# Patient Record
Sex: Male | Born: 1965 | Race: White | Hispanic: No | Marital: Single
Health system: Southern US, Community
[De-identification: ages and names within clinical notes are randomized; demographics above are authoritative.]

## PROBLEM LIST (undated history)

## (undated) DIAGNOSIS — R7303 Prediabetes: Secondary | ICD-10-CM

## (undated) DIAGNOSIS — R0789 Other chest pain: Secondary | ICD-10-CM

## (undated) DIAGNOSIS — E785 Hyperlipidemia, unspecified: Secondary | ICD-10-CM

## (undated) DIAGNOSIS — N503 Cyst of epididymis: Secondary | ICD-10-CM

## (undated) DIAGNOSIS — H9190 Unspecified hearing loss, unspecified ear: Secondary | ICD-10-CM

## (undated) DIAGNOSIS — R42 Dizziness and giddiness: Secondary | ICD-10-CM

## (undated) DIAGNOSIS — M109 Gout, unspecified: Secondary | ICD-10-CM

## (undated) DIAGNOSIS — K921 Melena: Secondary | ICD-10-CM

## (undated) HISTORY — DX: Unspecified hearing loss, unspecified ear: H91.90

## (undated) HISTORY — PX: APPENDECTOMY: SHX54

## (undated) HISTORY — PX: HERNIA REPAIR: SHX51

## (undated) HISTORY — DX: Other chest pain: R07.89

## (undated) HISTORY — DX: Cyst of epididymis: N50.3

## (undated) HISTORY — DX: Melena: K92.1

## (undated) HISTORY — PX: OTHER SURGICAL HISTORY: SHX169

## (undated) HISTORY — PX: VASECTOMY: SHX75

## (undated) HISTORY — DX: Dizziness and giddiness: R42

## (undated) HISTORY — DX: Hyperlipidemia, unspecified: E78.5

## (undated) HISTORY — DX: Gout, unspecified: M10.9

## (undated) HISTORY — DX: Prediabetes: R73.03

---

## 2005-08-10 ENCOUNTER — Encounter (INDEPENDENT_AMBULATORY_CARE_PROVIDER_SITE_OTHER): Payer: Self-pay | Admitting: Specialist

## 2005-08-10 ENCOUNTER — Ambulatory Visit (HOSPITAL_COMMUNITY): Admission: EM | Admit: 2005-08-10 | Discharge: 2005-08-10 | Payer: Self-pay | Admitting: Emergency Medicine

## 2005-08-25 ENCOUNTER — Encounter: Admission: RE | Admit: 2005-08-25 | Discharge: 2005-08-25 | Payer: Self-pay | Admitting: General Surgery

## 2006-08-30 IMAGING — CT CT PELVIS W/ CM
2 of 5 series · 17 of 46 positions shown, 19 images · IV contrast (READICAT/WATER & [ID] OMNI 300)
Comparison: CT of the abdomen done at [facility] 08/10/05.

CLINICAL DATA: Postappendectomy 08/10/05.  Persistent pain and fever.  Question abscess. 
 ABDOMEN CT WITH CONTRAST:
TECHNIQUE: Multidetector CT imaging of the abdomen was performed following the standard protocol during bolus administration of intravenous contrast.
 Contrast:  100 cc Omnipaque 300.  Oral contrast was given.
TECHNIQUE: Multidetector CT imaging of the pelvis was performed following the standard protocol during bolus administration of intravenous contrast.

[Series 102: a&p w/ · axial · 0.74mm/px · z∈[-453,-58]mm · 14 of 431 slices shown, 16 images]
[im 18/431  soft-tissue]
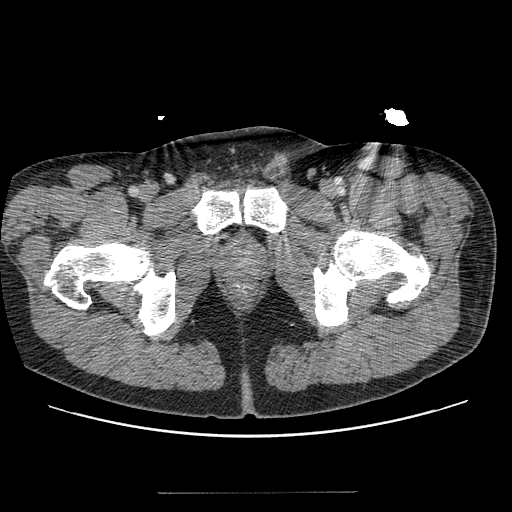
[im 18/431  bone]
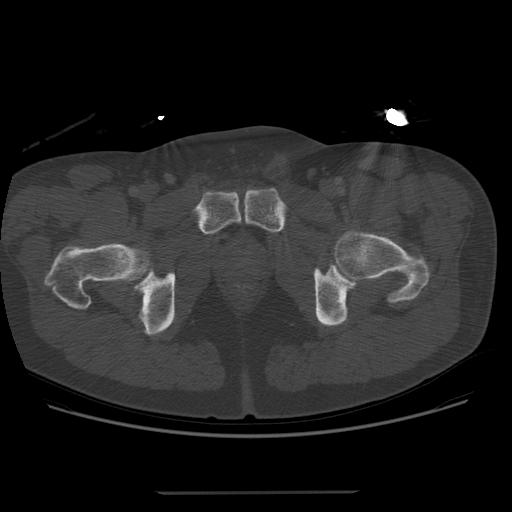
[im 54/431  soft-tissue]
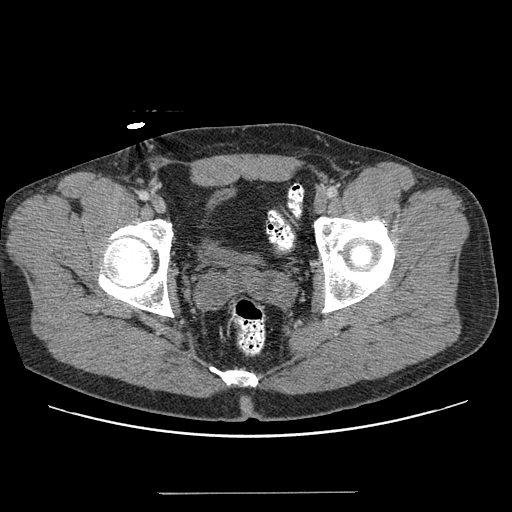
[im 90/431  soft-tissue]
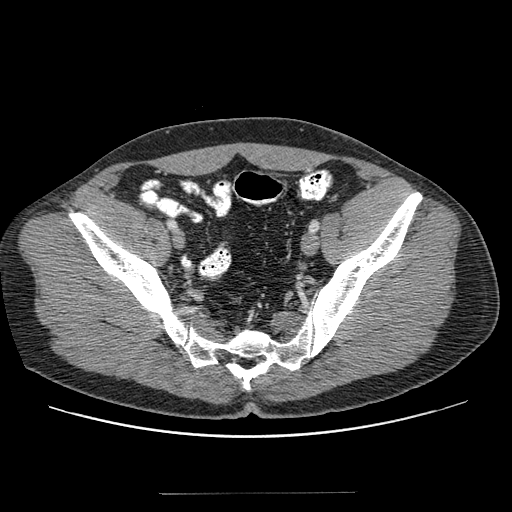
[im 108/431  soft-tissue]
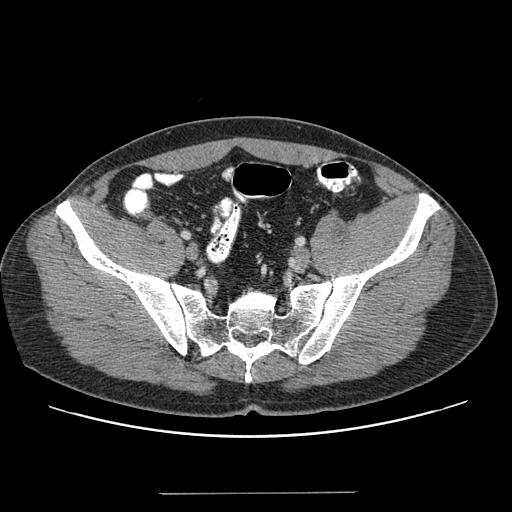
[im 144/431  soft-tissue]
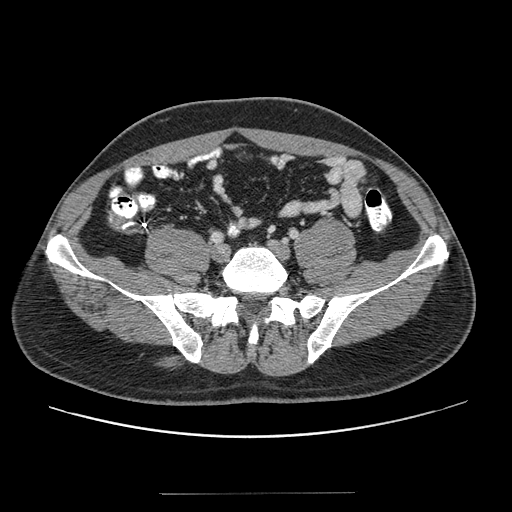
[im 180/431  soft-tissue]
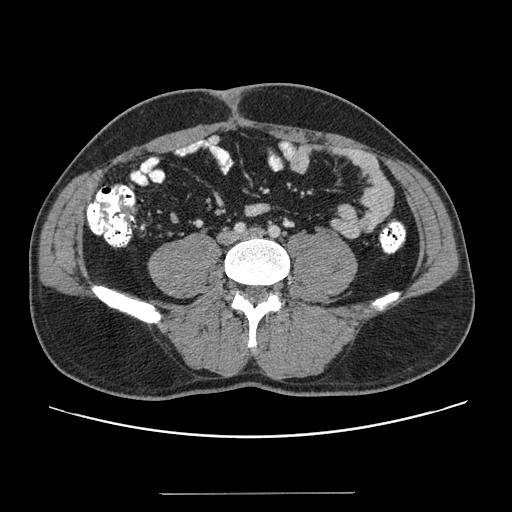
[im 198/431  soft-tissue]
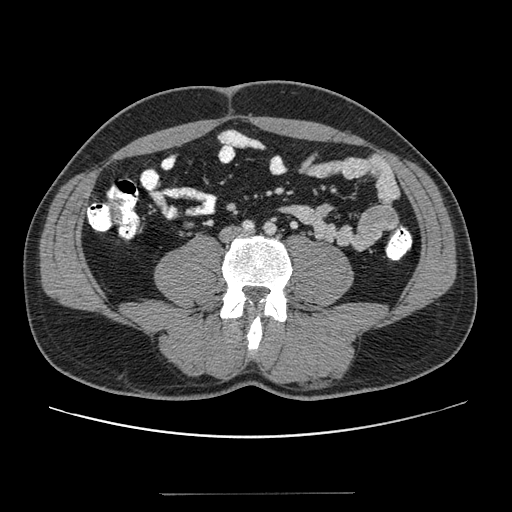
[im 233/431  soft-tissue]
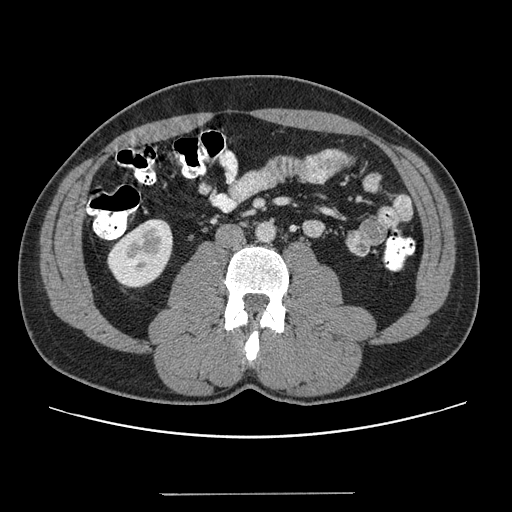
[im 251/431  soft-tissue]
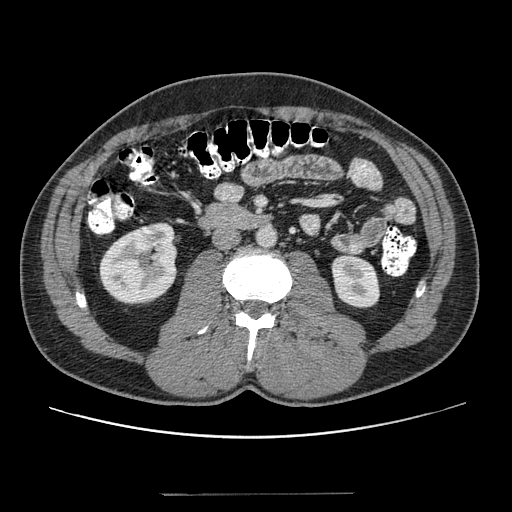
[im 251/431  bone]
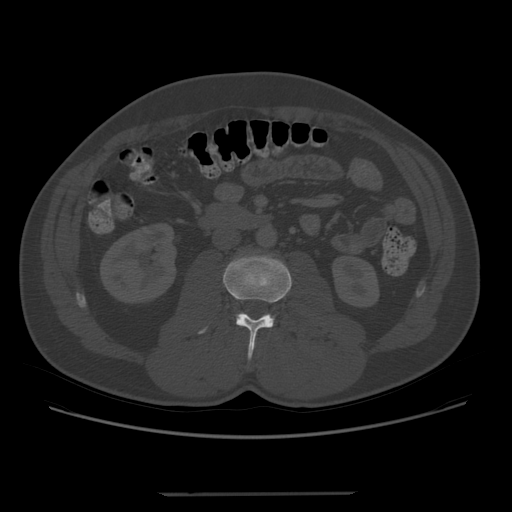
[im 287/431  soft-tissue]
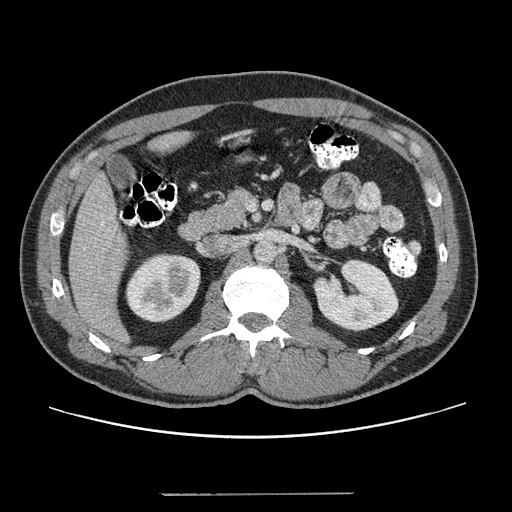
[im 323/431  soft-tissue]
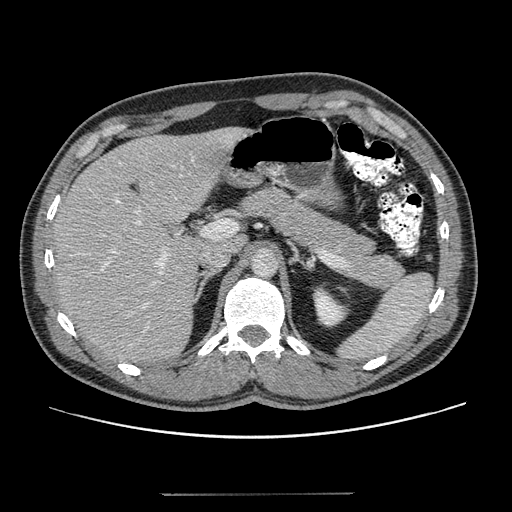
[im 341/431  soft-tissue]
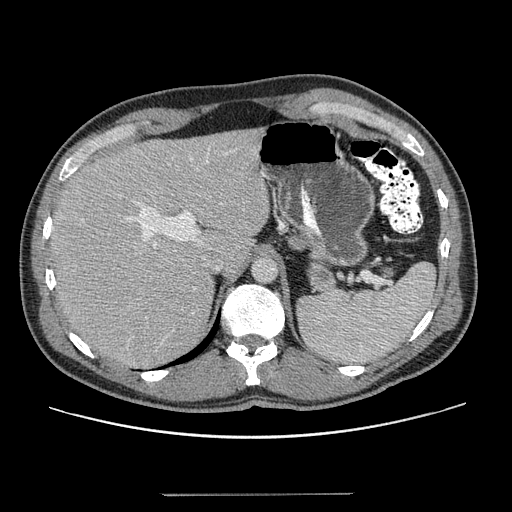
[im 377/431  soft-tissue]
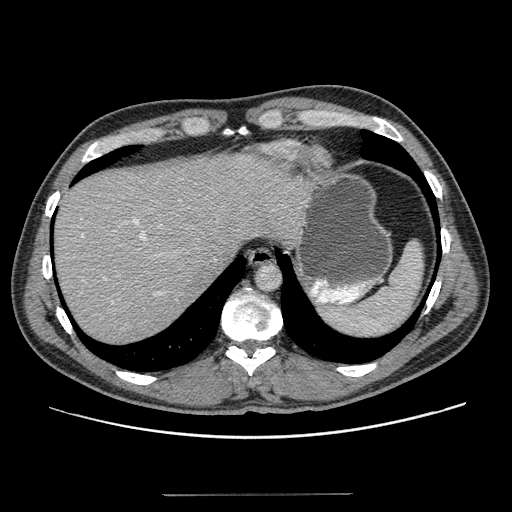
[im 413/431  soft-tissue]
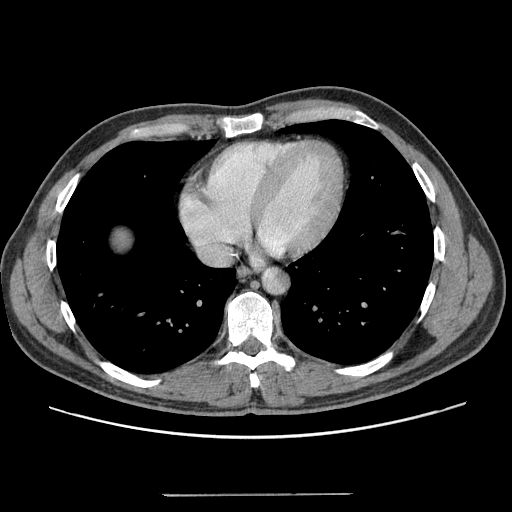

[Series 104: reformatted · coronal · 0.86mm/px · 3 of 82 slices shown]
[im 28/82  soft-tissue]
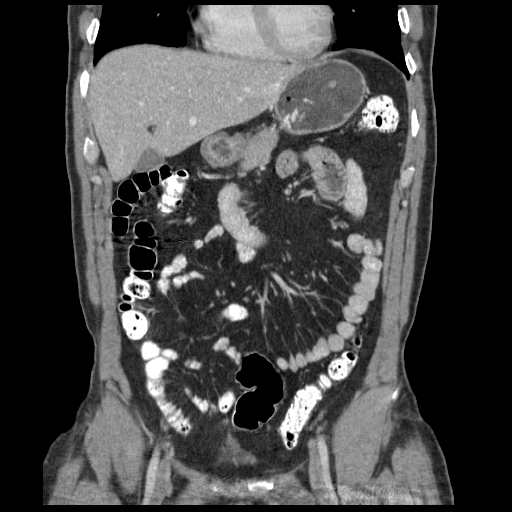
[im 37/82  soft-tissue]
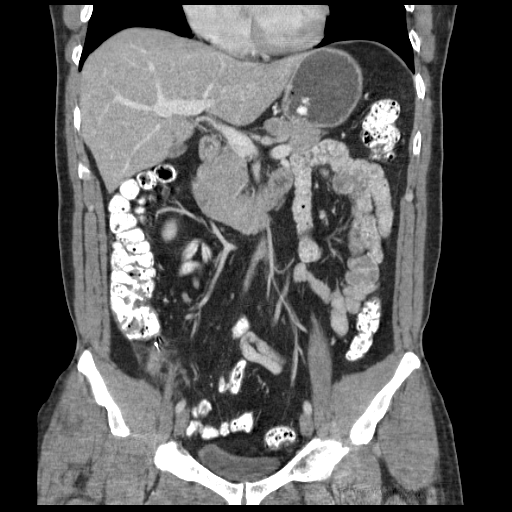
[im 46/82  soft-tissue]
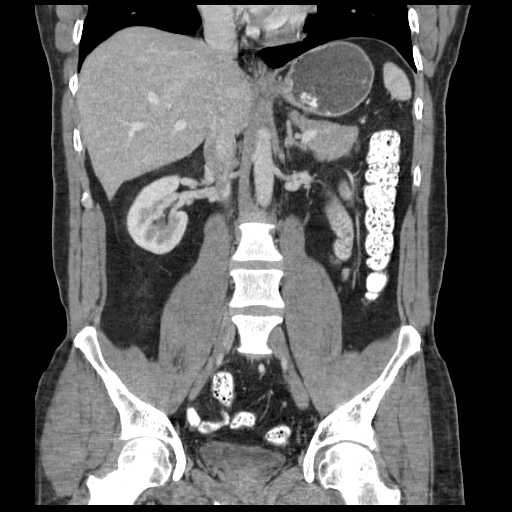

[17 of 46 positions shown; findings below may reference images not displayed]

FINDINGS: The lung bases are clear.  There is no pleural effusion.  The liver, spleen, gallbladder, pancreas, adrenal glands, and kidneys appear normal.  There is no evidence of ascites or intraabdominal abscess.  Small lymph nodes in the ileocolonic mesentery have decreased in size from the prior study.
IMPRESSION: No acute abdominal findings.  No evidence of abdominal abscess. 
 PELVIS CT WITH CONTRAST:
FINDINGS: There are postoperative changes in the right lower quadrant related to recent appendectomy.  Minimal stranding is seen adjacent to the cecal tip.  There is no pelvic fluid collection or adenopathy.  There is no evidence of hernia.  Prostate gland, seminal vesicles, and bladder appear stable.
IMPRESSION: Postoperative changes related to recent appendectomy.  No evidence of pelvic abscess or acute inflammatory process.

## 2008-08-05 ENCOUNTER — Ambulatory Visit (HOSPITAL_COMMUNITY): Admission: RE | Admit: 2008-08-05 | Discharge: 2008-08-05 | Payer: Self-pay | Admitting: General Surgery

## 2010-11-22 NOTE — Op Note (Signed)
NAMECantrell, [name]                ACCOUNT NO.:  [id number]   MEDICAL RECORD NO.:  [id number]          PATIENT TYPE:  AMB   LOCATION:  DAY                          FACILITY:  [facility]   PHYSICIAN:  Sharlet Salina T. Hoxworth, M.D.DATE OF BIRTH:  05-Nov-1965   DATE OF PROCEDURE:  08/05/2008  DATE OF DISCHARGE:                               OPERATIVE REPORT   PREOPERATIVE DIAGNOSIS:  Bilateral inguinal hernia.   POSTOPERATIVE DIAGNOSIS:  Bilateral inguinal hernia.   SURGICAL PROCEDURES:  Laparoscopic repair of bilateral inguinal hernia.   SURGEON:  Lorne Skeens. Hoxworth, M.D.   ANESTHESIA:  General.   BRIEF HISTORY:  [name] is a 45 year old male who on a recent exam by  his primary physician was found to have a definite bulge in his right  groin.  This has been somewhat uncomfortable as well.  Examination has  revealed a moderate-sized, reducible, left inguinal hernia and also a  definite small to moderate-sized right inguinal hernia as well.  After  discussion of options, we have elected proceed with laparoscopic  bilateral repair.  The nature of the procedure, its indications and  risks of bleeding infection, recurrence, anesthetic complications,  possible open surgery and slight risk of bowel or bladder injury have  been discussed understood.  He is now brought to operating room for this  procedure.   DESCRIPTION OF PROCEDURE:  The patient brought to the operating room and  placed in supine position on the operating table and general  endotracheal anesthesia was induced.  The abdomen and groins were widely  sterilely prepped and draped.  PAS were placed.  He received  preoperative IV antibiotics.  Correct patient and procedure were  verified.  A 1 cm incision was made beneath the umbilicus at the site of  his previous laparoscopic appendectomy scar.  Dissection carried down to  the anterior sheath which was opened just to the left of midline  transversely for 1 cm and the medial edge  of the left rectus muscle  retracted medially and the preperitoneal space entered under direct  vision.  The balloon dissector was passed into this space and passed  down the midline until its tip was at the pubic symphysis.  Then under  direct laparoscopic vision, the balloon was inflated with good bilateral  deployment.  It was left in place for a couple of minutes for  hemostasis, then removed and the structural balloon trocar inflated and  CO2 pressure applied.  There had been reasonably good bilateral  dissection but there was an opening in the peritoneum on the left 2 or 3  cm in length where there had been adhesions up around the internal ring  from probably his previous varicocele repair.  This resulted in  significant pneumoperitoneum and I then evacuated this without  difficulty in the right upper quadrant with a variation needle.  The  peritoneum was mobilized off its adhesions around the internal ring and  dissected posteriorly on the left and then the peritoneal defect was  closed securely with 5 mm clips.  This resulted in a pretty good  decompression of  the pneumoperitoneum and allowed plenty of working  room.  The peritoneum was then dissected further back posteriorly off of  the cord and there was no indirect defect.  The pubic symphysis,  Cooper's ligament were cleared out of the iliac vessels which were  identified and protected.  Just lateral to this, there was a good sized  direct defect, the pseudo sac and preperitoneal fat was completely  dissected out of the defect.  The pseudo sac completely mobilized and  Cooper's ligament cleared just beneath this and borders well-defined and  dissected.  Following this the peritoneum was dissected out laterally  dissecting it posterior away from the anterior abdominal wall out to  level of the anterior-superior iliac spine at the umbilicus.  The  peritoneum was then followed back again medially and again was well off  the cord  structures and there was no indirect hernia.  Following this  complete dissection of the left side, attention was turned to the right.  An identical dissection was performed.  There was actually a very small  opening in the peritoneum noted after dissecting the peritoneum off of  the cord structures which was closed with a couple of clips.  There did  appear to be a little scarring up around the internal ring of the  peritoneum but there was no indirect hernia.  Also noted on the right  was a moderate-sized direct hernia which was dissected in identical  fashion.  Following dissection of the right side, an extra large Bard 3-  D Max left-sided piece of mesh was introduced into the left  preperitoneal space and oriented and then tacked initially to the pubic  symphysis and down to Cooper's ligament with 2 or 3 tacks avoiding  carefully the iliac vessels.  The lateral edge of the mesh was then  tacked well out on the anterior abdominal wall being able to feel the  tacker through the abdominal wall and came back along the superior edge  and medial edge with tacks and a few additional tacks were placed along  the inguinal ligament again being able to feel the tacker through the  abdominal wall.  This provided nice very broad coverage of the direct  defect and extended well out laterally as well.  Following this, a large  right-sided piece of mesh was introduced in the right preperitoneal  space, oriented and fixed in an identical fashion.  Operative site was  inspect for hemostasis which appeared complete and then all CO2 was  evacuated and trocars removed.  The fascial defect at the umbilicus was  closed with a figure-of-eight 0 Vicryl.  The 5 mm trocar sites were  closed with Dermabond and the umbilical site due to a little oozing from  the scarring from its previous surgery was closed with a running 4-0  nylon with good hemostasis.  Sponge and needle counts were correct.  Dry  sterile  dressings were applied.  The patient taken to recovery in good  condition.      Lorne Skeens. Hoxworth, M.D.  Electronically Signed     BTH/MEDQ  D:  08/05/2008  T:  08/05/2008  Job:  [id number]

## 2010-11-25 NOTE — H&P (Signed)
NAMEWALDRON, GERRY                ACCOUNT NO.:  [id number]   MEDICAL RECORD NO.:  [id number]           PATIENT TYPE:   LOCATION:                                 FACILITY:   PHYSICIAN:  Sharlet Salina T. Hoxworth, M.D.  DATE OF BIRTH:   DATE OF ADMISSION:  08/10/2005  DATE OF DISCHARGE:                                HISTORY & PHYSICAL   CHIEF COMPLAINT:  Right lower quadrant abdominal pain.   HISTORY OF PRESENT ILLNESS:  [name] is a generally healthy 45 year old  white male who about 36 hours prior to admission developed poorly localized  abdominal pain across his lower abdomen.  Over the next few hours it  gradually localized to his right lower quadrant and became more severe and  constant.  He presented to his primary care office, was found to have  tenderness in the right lower quadrant and a white count of 11.9 and was  referred to [facility] Emergency Room for further evaluation.  He states  his pain is constant and worse with movement.  He has no history of any  previous similar or chronic GI or abdominal complaints.  He has not had a  bowel movement in about two days and feels a little constipated.  He has not  had any nausea or vomiting.  No fever or chills.   PAST MEDICAL HISTORY:  He has had a varicocele repair.  No significant  medical or surgical problems.   MEDICATIONS:  None.   ALLERGIES:  None.   SOCIAL HISTORY:  He is an Acupuncturist.  He is single.  Does not  smoke cigarettes.  Drinks occasional alcohol.   FAMILY HISTORY:  Noncontributory.   REVIEW OF SYSTEMS:  GENERAL:  No fever, chills, malaise.  HEENT:  No vision,  hearing, swallowing problems.  RESPIRATORY:  No shortness of breath, cough,  history of asthma.  CARDIAC:  No chest pain, palpitations, history of heart  disease.  ABDOMEN/GI:  As above.  GU: No urinary burning, frequency.  NEUROLOGIC:  No history of blood clots or abnormal bleeding.   PHYSICAL EXAMINATION:  VITAL SIGNS:  Temperature  is 97.8, pulse 77,  respirations 20, blood pressure 136/83.  GENERAL:  Well-developed male in no acute distress.  SKIN:  Warm and dry.  No rash or infection.  HEENT:  No palpable mass or thyromegaly.  Sclerae nonicteric.  LYMPH NODE:  No cervical, subclavicular, or inguinal nodes palpable.  LUNGS:  Clear without wheezing or increased work of breathing.  CARDIAC:  Regular rate and rhythm.  There is no murmurs.  No edema.  ABDOMEN:  Nondistended.  Hypoactive bowel sounds.  There is localized  tenderness in the right lower quadrant with guarding and evidence of local  peritonitis.  No palpable masses or splenomegaly.  GU:  Normal male.  EXTREMITIES:  No joint swelling/deformity.  NEUROLOGIC:  Alert, oriented.  Motor and sensory examination grossly normal.   LABORATORY AND X-RAY DATA:  BMET and CBC obtained in the emergency room are  normal.  Previous white count in primary care physician's office today  earlier  11.8.   CT scan of the abdomen and pelvis personally reviewed.  This shows fluid-  filled distended appendix with periappendiceal inflammatory changes  consistent with acute appendicitis.   ASSESSMENT/PLAN:  Acute appendicitis.  The patient is being given broad-  spectrum intravenous antibiotics.  He will be taken to the operating room  for laparoscopic appendectomy.      Lorne Skeens. Hoxworth, M.D.  Electronically Signed     BTH/MEDQ  D:  08/10/2005  T:  08/10/2005  Job:  [id number]

## 2010-11-25 NOTE — Op Note (Signed)
NAMEQUNICY, HIGINBOTHAM                ACCOUNT NO.:  [id number]   MEDICAL RECORD NO.:  [id number]          PATIENT TYPE:  INP   LOCATION:  0098                         [facility]AC[facility]   PHYSICIAN:  Sharlet Salina T. Hoxworth, M.D.DATE OF BIRTH:  1966-01-06   DATE OF PROCEDURE:  08/10/2005  DATE OF DISCHARGE:                                 OPERATIVE REPORT   PREOPERATIVE DIAGNOSIS:  Acute appendicitis.   POSTOPERATIVE DIAGNOSIS:  Acute appendicitis.   SURGICAL PROCEDURES:  Laparoscopic appendectomy.   SURGEON:  Dr. Johna Sheriff   ANESTHESIA:  General.   BRIEF HISTORY:  Marck Mcclenny is a 45 year old male, who presents with 64-  hour history of diffuse lower and then right lower quadrant abdominal pain.  A CT scan was obtained in the emergency room showing evidence of acute  appendicitis.  Laparoscopic and possible open appendectomy have been  recommend and accepted. This procedure, indications, risks of bleeding,  infection were discussed and understood.  He is now brought to operating  room for this procedure.   DESCRIPTION OF OPERATION:  The patient brought to the operating room and  placed in supine position on the operating table, and general endotracheal  anesthesia was induced.  He had received preoperative IV antibiotics.  Foley  catheter was placed.  The abdomen was widely sterilely prepped and draped.  Correct patient and procedure were verified.  Local anesthesia was used to  infiltrate the trocar sites prior to the incisions.  A 1 cm incision was  made at the umbilicus and dissection carried down to the midline fascia  which was sharply incised for 1 cm and the peritoneum entered under direct  vision.  Through a mattress sutures of 0 Vicryl, the Hasson trocar was  placed and pneumoperitoneum established.  Under direct vision, a 5 mm trocar  was placed in the right upper quadrant and a 12 mm trocar in the left lower  quadrant.  The terminal ileum was densely adherent to the  lateral pelvic  sidewall with inflammatory adhesions. These were carefully taken down with  blunt dissection and the terminal ileum further mobilized more distally,  dividing peritoneal attachments.  The appendix was lying on the terminal  ileum, was severely acutely inflamed with exudate but no gangrene or  perforation.  The appendix was fairly densely adherent to the terminal ileum  but was able to be teased away with careful blunt dissection and some use of  the harmonic scalpel, avoiding the bowel.  The terminal ileum, cecum could  then be rotated medially, and the base the appendix was seen more laterally  which was noninflamed.  The mesoappendix was then sequentially divided with  the harmonic scalpel, further mobilizing the appendix until I was able to  get it completely freed down to its base which was noninflamed.  There was  some bleeding at this point from the mesoappendix, but this was controlled  with hemoclips.  The appendix was then divided at or just below its base  with the single firing of the GIA 45 mm blue load stapler.  The appendix was  placed in EndoCatch  bag and brought out through the umbilicus.  The right  lower quadrant was then thoroughly irrigated.  A few smaller bleeding points  along the mesoappendix were controlled with harmonic scalpel, and the  abdomen was thoroughly irrigated and complete hemostasis assured.  Following  this,  all CO2 was evacuated, trocars removed, and the mattress suture secured at  the umbilicus.  Skin incisions were closed with interrupted subcuticular 4-0  Monocryl and Steri-Strips.  Sponge, needle, and instrument counts correct.  Dry sterile dressings were applied and the patient taken to recovery in good  condition.      Lorne Skeens. Hoxworth, M.D.  Electronically Signed     BTH/MEDQ  D:  08/10/2005  T:  08/10/2005  Job:  [id number]

## 2016-07-13 DIAGNOSIS — M109 Gout, unspecified: Secondary | ICD-10-CM | POA: Diagnosis not present

## 2016-07-13 DIAGNOSIS — Z0000 Encounter for general adult medical examination without abnormal findings: Secondary | ICD-10-CM | POA: Diagnosis not present

## 2016-07-13 DIAGNOSIS — E782 Mixed hyperlipidemia: Secondary | ICD-10-CM | POA: Diagnosis not present

## 2017-07-19 DIAGNOSIS — J322 Chronic ethmoidal sinusitis: Secondary | ICD-10-CM | POA: Diagnosis not present

## 2017-07-19 DIAGNOSIS — H8103 Meniere's disease, bilateral: Secondary | ICD-10-CM | POA: Diagnosis not present

## 2017-07-19 DIAGNOSIS — J320 Chronic maxillary sinusitis: Secondary | ICD-10-CM | POA: Diagnosis not present

## 2017-08-02 DIAGNOSIS — L821 Other seborrheic keratosis: Secondary | ICD-10-CM | POA: Diagnosis not present

## 2017-08-02 DIAGNOSIS — L738 Other specified follicular disorders: Secondary | ICD-10-CM | POA: Diagnosis not present

## 2017-08-02 DIAGNOSIS — L218 Other seborrheic dermatitis: Secondary | ICD-10-CM | POA: Diagnosis not present

## 2017-09-19 DIAGNOSIS — M109 Gout, unspecified: Secondary | ICD-10-CM | POA: Diagnosis not present

## 2017-10-24 DIAGNOSIS — Z1159 Encounter for screening for other viral diseases: Secondary | ICD-10-CM | POA: Diagnosis not present

## 2017-10-24 DIAGNOSIS — Z13220 Encounter for screening for lipoid disorders: Secondary | ICD-10-CM | POA: Diagnosis not present

## 2017-10-24 DIAGNOSIS — Z0000 Encounter for general adult medical examination without abnormal findings: Secondary | ICD-10-CM | POA: Diagnosis not present

## 2018-01-24 DIAGNOSIS — R0789 Other chest pain: Secondary | ICD-10-CM | POA: Diagnosis not present

## 2018-01-24 DIAGNOSIS — M109 Gout, unspecified: Secondary | ICD-10-CM | POA: Diagnosis not present

## 2018-01-24 DIAGNOSIS — E782 Mixed hyperlipidemia: Secondary | ICD-10-CM | POA: Diagnosis not present

## 2018-02-04 DIAGNOSIS — R9389 Abnormal findings on diagnostic imaging of other specified body structures: Secondary | ICD-10-CM | POA: Diagnosis not present

## 2018-03-27 DIAGNOSIS — M109 Gout, unspecified: Secondary | ICD-10-CM | POA: Diagnosis not present

## 2018-03-27 DIAGNOSIS — E782 Mixed hyperlipidemia: Secondary | ICD-10-CM | POA: Diagnosis not present

## 2018-03-29 DIAGNOSIS — E782 Mixed hyperlipidemia: Secondary | ICD-10-CM | POA: Diagnosis not present

## 2018-04-30 DIAGNOSIS — H5213 Myopia, bilateral: Secondary | ICD-10-CM | POA: Diagnosis not present

## 2018-05-03 DIAGNOSIS — Z23 Encounter for immunization: Secondary | ICD-10-CM | POA: Diagnosis not present

## 2018-06-10 DIAGNOSIS — Z1211 Encounter for screening for malignant neoplasm of colon: Secondary | ICD-10-CM | POA: Diagnosis not present

## 2019-10-11 ENCOUNTER — Ambulatory Visit: Payer: 59

## 2019-10-11 ENCOUNTER — Ambulatory Visit: Payer: 59 | Attending: Internal Medicine

## 2019-10-11 DIAGNOSIS — Z23 Encounter for immunization: Secondary | ICD-10-CM

## 2019-10-11 NOTE — Progress Notes (Signed)
   Covid-19 Vaccination Clinic  Name:  [name]    MRN: [id number] DOB: 01-09-1966  10/11/2019  [name] was observed post Covid-19 immunization for 15 minutes without incident. He was provided with Vaccine Information Sheet and instruction to access the V-Safe system.   [name] was instructed to call 911 with any severe reactions post vaccine: Marland Kitchen Difficulty breathing  . Swelling of face and throat  . A fast heartbeat  . A bad rash all over body  . Dizziness and weakness   Immunizations Administered    Name Date Dose VIS Date Route   Pfizer COVID-19 Vaccine 10/11/2019  4:19 PM 0.3 mL 06/20/2019 Intramuscular   Manufacturer: [employer], [employer]   Lot: [id number]   NDC: [address]-9

## 2019-11-04 ENCOUNTER — Ambulatory Visit: Payer: 59 | Attending: Internal Medicine

## 2019-11-04 DIAGNOSIS — Z23 Encounter for immunization: Secondary | ICD-10-CM

## 2019-11-04 NOTE — Progress Notes (Signed)
   Covid-19 Vaccination Clinic  Name:  [name]    MRN: [id number] DOB: [birth year]  11/04/2019  [name] was observed post Covid-19 immunization for 15 minutes without incident. He was provided with Vaccine Information Sheet and instruction to access the V-Safe system.   [name] was instructed to call 911 with any severe reactions post vaccine: Marland Kitchen Difficulty breathing  . Swelling of face and throat  . A fast heartbeat  . A bad rash all over body  . Dizziness and weakness   Immunizations Administered    Name Date Dose VIS Date Route   Pfizer COVID-19 Vaccine 11/04/2019  3:22 PM 0.3 mL 09/03/2018 Intramuscular   Manufacturer: [employer], [employer]   Lot: [id number]   NDC: [address]-6

## 2020-04-11 ENCOUNTER — Other Ambulatory Visit (INDEPENDENT_AMBULATORY_CARE_PROVIDER_SITE_OTHER): Payer: Self-pay | Admitting: Otolaryngology

## 2020-04-12 ENCOUNTER — Other Ambulatory Visit (INDEPENDENT_AMBULATORY_CARE_PROVIDER_SITE_OTHER): Payer: Self-pay | Admitting: Otolaryngology

## 2020-04-20 ENCOUNTER — Telehealth (INDEPENDENT_AMBULATORY_CARE_PROVIDER_SITE_OTHER): Payer: Self-pay

## 2020-06-11 ENCOUNTER — Ambulatory Visit: Payer: 59 | Attending: Internal Medicine

## 2020-06-11 DIAGNOSIS — Z23 Encounter for immunization: Secondary | ICD-10-CM

## 2020-06-11 NOTE — Progress Notes (Signed)
   Covid-19 Vaccination Clinic  Name:  [name]    MRN: [id number] DOB: [birth year]  06/11/2020  [name] was observed post Covid-19 immunization for 15 minutes without incident. He was provided with Vaccine Information Sheet and instruction to access the V-Safe system.   [name] was instructed to call 911 with any severe reactions post vaccine: Marland Kitchen Difficulty breathing  . Swelling of face and throat  . A fast heartbeat  . A bad rash all over body  . Dizziness and weakness   Immunizations Administered    Name Date Dose VIS Date Route   Pfizer COVID-19 Vaccine 06/11/2020  3:26 PM 0.3 mL 04/28/2020 Intramuscular   Manufacturer: [employer], [employer]   Lot: [id number]   NDC: [address]-5

## 2023-10-31 ENCOUNTER — Encounter: Payer: Self-pay | Admitting: Cardiovascular Disease

## 2023-10-31 ENCOUNTER — Ambulatory Visit: Payer: 59 | Attending: Cardiovascular Disease | Admitting: Cardiovascular Disease

## 2023-10-31 VITALS — BP 116/68 | HR 61 | Ht 67.0 in | Wt 187.2 lb

## 2023-10-31 DIAGNOSIS — Z7689 Persons encountering health services in other specified circumstances: Secondary | ICD-10-CM

## 2023-10-31 DIAGNOSIS — Z01810 Encounter for preprocedural cardiovascular examination: Secondary | ICD-10-CM | POA: Diagnosis not present

## 2023-10-31 DIAGNOSIS — Z8249 Family history of ischemic heart disease and other diseases of the circulatory system: Secondary | ICD-10-CM

## 2023-10-31 DIAGNOSIS — R079 Chest pain, unspecified: Secondary | ICD-10-CM | POA: Diagnosis not present

## 2023-10-31 NOTE — Patient Instructions (Addendum)
 Lab Work: BMET today If you have labs (blood work) drawn today and your tests are completely normal, you will receive your results only by: [employer] (if you have MyChart) OR A paper copy in the mail If you have any lab test that is abnormal or we need to change your treatment, we will call you to review the results.  Testing/Procedures: Coronary CT Angiogram Cardiac CT Angiography (CTA), is a special type of CT scan that uses a computer to produce multi-dimensional views of major blood vessels throughout the body. In CT angiography, a contrast material is injected through an IV to help visualize the blood vessels  Follow-Up: At [facility], you and your health needs are our priority.  As part of our continuing mission to provide you with exceptional heart care, our providers are all part of one team.  This team includes your primary Cardiologist (physician) and Advanced Practice Providers or APPs (Physician Assistants and Nurse Practitioners) who all work together to provide you with the care you need, when you need it.  Your next appointment:   As Needed  Provider:   Ahmad Alert, MD  Other Instructions   Your cardiac CT will be scheduled at one of the below locations:   [facility] [address] [place], [place] [address] [phone]  Jeralene Mom. [facility] and Vascular Tower [address]  [place], [place] [address] Opening November 05, 2023  If scheduled at [facility], please arrive at the [facility] and Children's Entrance (Entrance C2) of [facility] 30 minutes prior to test start time. You can use the FREE valet parking offered at entrance C (encouraged to control the heart rate for the test)  Proceed to the [facility] Radiology Department (first floor) to check-in and test prep.   All radiology patients and guests should use entrance C2 at [facility], accessed from [facility], even though the hospital's  physical address listed is [address].    If scheduled at the Heart and Vascular Tower at [address], please enter the parking lot using the [address] entrance and use the FREE valet service at the patient drop-off area. Enter the buidling and check-in with registration on the main floor.  Please follow these instructions carefully (unless otherwise directed):  An IV will be required for this test and Nitroglycerin will be given.  Hold all erectile dysfunction medications at least 3 days (72 hrs) prior to test. (Ie viagra, cialis, sildenafil, tadalafil, etc)   On the Night Before the Test: Be sure to Drink plenty of water. Do not consume any caffeinated/decaffeinated beverages or chocolate 12 hours prior to your test. Do not take any antihistamines 12 hours prior to your test.  On the Day of the Test: Drink plenty of water until 1 hour prior to the test. Do not eat any food 1 hour prior to test. You may take your regular medications prior to the test.  If you take Chlorthalidone, please HOLD on the morning of the test. Patients who wear a continuous glucose monitor MUST remove the device prior to scanning.      After the Test: Drink plenty of water. After receiving IV contrast, you may experience a mild flushed feeling. This is normal. On occasion, you may experience a mild rash up to 24 hours after the test. This is not dangerous. If this occurs, you can take Benadryl 25 mg, Zyrtec, Claritin, or Allegra and increase your fluid intake. (  Patients taking Tikosyn should avoid Benadryl, and may take Zyrtec, Claritin, or Allegra) If you experience trouble breathing, this can be serious. If it is severe call 911 IMMEDIATELY. If it is mild, please call our office.  We will call to schedule your test 2-4 weeks out understanding that some insurance companies will need an authorization prior to the service being performed.   For more information and frequently asked  questions, please visit our website : [email or web address]  For non-scheduling related questions, please contact the cardiac imaging nurse navigator should you have any questions/concerns: Cardiac Imaging Nurse Navigators Direct Office Dial: [phone]   For scheduling needs, including cancellations and rescheduling, please call [place], [phone].       1st Floor: - Lobby - Registration  - Pharmacy  - Lab - Cafe  2nd Floor: - PV Lab - Diagnostic Testing (echo, CT, nuclear med)  3rd Floor: - Vacant  4th Floor: - TCTS (cardiothoracic surgery) - AFib Clinic - Structural Heart Clinic - Vascular Surgery  - Vascular Ultrasound  5th Floor: - HeartCare Cardiology (general and EP) - Clinical Pharmacy for coumadin, hypertension, lipid, weight-loss medications, and med management appointments    Valet parking services will be available as well.

## 2023-10-31 NOTE — Progress Notes (Signed)
  Cardiology Office Note:  .   Date:  10/31/2023  ID:  [name], DOB [birth year], MRN [id number] PCP: No primary care provider on file.  [name] Cardiologist:  None    History of Present Illness: .   [name] is a 58 y.o. male with history of hyperlipidemia, hypertension and chest discomfort.   the pains occur in the upper center part of his chest.  They might last for several minutes.  He describes them as a sharp pain.  Not exertional   Not related to eating or drinking  Occurs at rest when he is on the computer, or watching TV ,  Do not occur with exercise  Eats an unrestructed diet   Does not exercise   Lipid levels from August 10, 2023 reveal Total cholesterol is 174 HDL is 50 LDL is 93 Glyceride levels 175 Hemoglobin A1c is 5.8    Works as on Art gallery manager for [employer]. Hx  Father died at age 77 of massive MI  Mother - atrial fib , pancreatic insufficiency, former  Brother - HTN ,      ROS:   Studies Reviewed: [name]   EKG Interpretation Date/Time:  Wednesday October 31 2023 10:11:57 EDT Ventricular Rate:  48 PR Interval:  192 QRS Duration:  102 QT Interval:  404 QTC Calculation: 360 R Axis:   65  Text Interpretation: Sinus bradycardia No previous ECGs available Confirmed by Ahmad Alert ([id number]) on 10/31/2023 10:19:49 AM     Risk Assessment/Calculations:             Physical Exam:   VS:  BP 116/68   Pulse 61   Ht 5\' 7"  (1.702 m)   Wt 187 lb 3.2 oz (84.9 kg)   SpO2 97%   BMI 29.32 kg/m    Wt Readings from Last 3 Encounters:  10/31/23 187 lb 3.2 oz (84.9 kg)    GEN: Well nourished, well developed in no acute distress NECK: No JVD; No carotid bruits CARDIAC: RRR, no murmurs, rubs, gallops RESPIRATORY:  Clear to auscultation without rales, wheezing or rhonchi  ABDOMEN: Soft, non-tender, non-distended EXTREMITIES:  No edema; No deformity   ASSESSMENT AND PLAN: .    Chest pain :   he has a hx of HLD, +  family hx of CAD .   Will order a coronary CTA for further evaluation .    I've asked him to increase his exercise ,  Work on a lower carb diet   Will see what the Cor CTA shows and will see him back if it shows any significant abnormality .         Dispo: PRN   Signed, Ahmad Alert, MD

## 2023-11-14 ENCOUNTER — Telehealth (HOSPITAL_COMMUNITY): Payer: Self-pay | Admitting: *Deleted

## 2023-11-14 NOTE — Telephone Encounter (Signed)
 Attempted to call patient regarding upcoming cardiac CT appointment. Left message on voicemail with name and callback number Johney Frame RN Navigator Cardiac Imaging [facility] [phone] Office

## 2023-11-15 ENCOUNTER — Ambulatory Visit (HOSPITAL_COMMUNITY)
Admission: RE | Admit: 2023-11-15 | Discharge: 2023-11-15 | Disposition: A | Discharge status: Home / Self Care | Source: Ambulatory Visit | Attending: Cardiovascular Disease | Admitting: Cardiovascular Disease

## 2023-11-15 VITALS — BP 138/69 | HR 69

## 2023-11-15 DIAGNOSIS — Z8249 Family history of ischemic heart disease and other diseases of the circulatory system: Secondary | ICD-10-CM | POA: Diagnosis present

## 2023-11-15 DIAGNOSIS — R079 Chest pain, unspecified: Secondary | ICD-10-CM | POA: Insufficient documentation

## 2023-11-15 DIAGNOSIS — I2510 Atherosclerotic heart disease of native coronary artery without angina pectoris: Secondary | ICD-10-CM | POA: Diagnosis not present

## 2023-11-15 MED ORDER — NITROGLYCERIN 0.4 MG SL SUBL
0.8000 mg | SUBLINGUAL_TABLET | Freq: Once | SUBLINGUAL | Status: AC
  Administered 2023-11-15: 0.8 mg via SUBLINGUAL

## 2023-11-15 MED ORDER — METOPROLOL TARTRATE 5 MG/5ML IV SOLN: 10.0000 mg | INTRAVENOUS | Status: DC | PRN

## 2023-11-15 MED ORDER — IOHEXOL 350 MG/ML SOLN
100.0000 mL | Freq: Once | INTRAVENOUS | Status: AC | PRN
  Administered 2023-11-15: 100 mL via INTRAVENOUS

## 2023-11-15 MED ORDER — DILTIAZEM HCL 25 MG/5ML IV SOLN: 10.0000 mg | INTRAVENOUS | Status: DC | PRN

## 2023-11-16 ENCOUNTER — Ambulatory Visit (HOSPITAL_BASED_OUTPATIENT_CLINIC_OR_DEPARTMENT_OTHER)
Admission: RE | Admit: 2023-11-16 | Discharge: 2023-11-16 | Disposition: A | Discharge status: Home / Self Care | Source: Ambulatory Visit | Attending: Internal Medicine | Admitting: Internal Medicine

## 2023-11-16 DIAGNOSIS — R931 Abnormal findings on diagnostic imaging of heart and coronary circulation: Secondary | ICD-10-CM | POA: Diagnosis not present

## 2023-11-16 DIAGNOSIS — R079 Chest pain, unspecified: Secondary | ICD-10-CM | POA: Diagnosis not present

## 2023-11-21 ENCOUNTER — Ambulatory Visit: Payer: Self-pay | Admitting: Cardiovascular Disease

## 2023-11-21 DIAGNOSIS — Z79899 Other long term (current) drug therapy: Secondary | ICD-10-CM

## 2023-11-21 DIAGNOSIS — E782 Mixed hyperlipidemia: Secondary | ICD-10-CM

## 2023-11-21 MED ORDER — ROSUVASTATIN CALCIUM 20 MG PO TABS: 20.0000 mg | ORAL_TABLET | Freq: Every day | ORAL | 3 refills | Status: AC

## 2023-11-21 NOTE — Telephone Encounter (Signed)
-----   Message from Ahmad Alert sent at 11/21/2023  9:30 AM EDT ----- CAC score is 110,  72nd percentile for age/ sex matched controls Mild - moderate plaque FFR evaluation does not show any significant obstructive CAD His last LDL in Jan. 2025 was 93 His goal LDL is no < 70 Please increase rosuvastatin to 20 mg a day  Check lipids, ALT in 3 months

## 2023-11-21 NOTE — Telephone Encounter (Signed)
 Called and spoke with patient who agrees to plan. Medication sent to pharmacy on file. Labs entered and released for him to have drawn in 3 months. Discussed the potential to add zetia and later a PCSK9i with lipid clinic if needed.

## 2024-04-07 NOTE — Telephone Encounter (Signed)
 Pt has called into office wanting Rx refills and Pharmacy has sent request. However since it has been over a year since pt has been seen in office we are unable to refill without an office visit. I did send this info to pharmacy and left message on pt home phone.
